# Patient Record
Sex: Male | Born: 1985 | Hispanic: Yes | Marital: Single | Smoking: Former smoker
Health system: Southern US, Community
[De-identification: ages and names within clinical notes are randomized; demographics above are authoritative.]

## PROBLEM LIST (undated history)

## (undated) DIAGNOSIS — T753XXA Motion sickness, initial encounter: Secondary | ICD-10-CM

## (undated) DIAGNOSIS — Z973 Presence of spectacles and contact lenses: Secondary | ICD-10-CM

## (undated) HISTORY — PX: NO PAST SURGERIES: SHX2092

---

## 2012-01-05 ENCOUNTER — Emergency Department: Payer: Self-pay | Admitting: Emergency Medicine

## 2012-01-05 LAB — BASIC METABOLIC PANEL
Anion Gap: 10 (ref 7–16)
BUN: 14 mg/dL (ref 7–18)
Chloride: 105 mmol/L (ref 98–107)
Co2: 26 mmol/L (ref 21–32)
EGFR (African American): >60
Glucose: 100 mg/dL — ABNORMAL HIGH (ref 65–99)
Osmolality: 282 (ref 275–301)
Potassium: 3.5 mmol/L (ref 3.5–5.1)
Sodium: 141 mmol/L (ref 136–145)

## 2012-01-05 LAB — CBC
HCT: 41.8 % (ref 40.0–52.0)
HGB: 14.1 g/dL (ref 13.0–18.0)
MCHC: 33.8 g/dL (ref 32.0–36.0)
RDW: 13.3 % (ref 11.5–14.5)
WBC: 4.8 10*3/uL (ref 3.8–10.6)

## 2016-07-29 ENCOUNTER — Encounter: Payer: Self-pay | Admitting: Emergency Medicine

## 2016-07-29 ENCOUNTER — Emergency Department
Admission: EM | Admit: 2016-07-29 | Discharge: 2016-07-29 | Disposition: A | Discharge status: Home / Self Care | Payer: No Typology Code available for payment source | Attending: Emergency Medicine | Admitting: Emergency Medicine

## 2016-07-29 DIAGNOSIS — Z87891 Personal history of nicotine dependence: Secondary | ICD-10-CM | POA: Diagnosis not present

## 2016-07-29 DIAGNOSIS — S3992XA Unspecified injury of lower back, initial encounter: Secondary | ICD-10-CM | POA: Diagnosis present

## 2016-07-29 DIAGNOSIS — Y999 Unspecified external cause status: Secondary | ICD-10-CM | POA: Diagnosis not present

## 2016-07-29 DIAGNOSIS — Y9389 Activity, other specified: Secondary | ICD-10-CM | POA: Insufficient documentation

## 2016-07-29 DIAGNOSIS — Y92410 Unspecified street and highway as the place of occurrence of the external cause: Secondary | ICD-10-CM | POA: Insufficient documentation

## 2016-07-29 DIAGNOSIS — M545 Low back pain, unspecified: Secondary | ICD-10-CM

## 2016-07-29 MED ORDER — ACETAMINOPHEN 325 MG PO TABS
650.0000 mg | ORAL_TABLET | Freq: Once | ORAL | Status: AC
  Administered 2016-07-29: 650 mg via ORAL
  Filled 2016-07-29: qty 2

## 2016-07-29 NOTE — Discharge Instructions (Signed)
Take Tylenol as needed for pain relief. [facility].
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
## 2018-04-20 ENCOUNTER — Emergency Department
Admission: EM | Admit: 2018-04-20 | Discharge: 2018-04-20 | Disposition: A | Discharge status: Home / Self Care | Payer: Self-pay | Attending: Emergency Medicine | Admitting: Emergency Medicine

## 2018-04-20 ENCOUNTER — Other Ambulatory Visit: Payer: Self-pay

## 2018-04-20 ENCOUNTER — Emergency Department: Payer: Self-pay

## 2018-04-20 DIAGNOSIS — Z87891 Personal history of nicotine dependence: Secondary | ICD-10-CM | POA: Insufficient documentation

## 2018-04-20 DIAGNOSIS — X509XXA Other and unspecified overexertion or strenuous movements or postures, initial encounter: Secondary | ICD-10-CM | POA: Insufficient documentation

## 2018-04-20 DIAGNOSIS — Y93H2 Activity, gardening and landscaping: Secondary | ICD-10-CM | POA: Insufficient documentation

## 2018-04-20 DIAGNOSIS — Y998 Other external cause status: Secondary | ICD-10-CM | POA: Insufficient documentation

## 2018-04-20 DIAGNOSIS — Y92017 Garden or yard in single-family (private) house as the place of occurrence of the external cause: Secondary | ICD-10-CM | POA: Insufficient documentation

## 2018-04-20 DIAGNOSIS — S82842A Displaced bimalleolar fracture of left lower leg, initial encounter for closed fracture: Secondary | ICD-10-CM | POA: Insufficient documentation

## 2018-04-20 MED ORDER — IBUPROFEN 600 MG PO TABS: 600.0000 mg | ORAL_TABLET | Freq: Three times a day (TID) | ORAL | 0 refills | Status: DC | PRN

## 2018-04-20 MED ORDER — IBUPROFEN 600 MG PO TABS
600.0000 mg | ORAL_TABLET | Freq: Once | ORAL | Status: AC
  Administered 2018-04-20: 600 mg via ORAL
  Filled 2018-04-20: qty 1

## 2018-04-20 MED ORDER — TRAMADOL HCL 50 MG PO TABS
50.0000 mg | ORAL_TABLET | Freq: Once | ORAL | Status: AC
  Administered 2018-04-20: 50 mg via ORAL
  Filled 2018-04-20: qty 1

## 2018-04-20 MED ORDER — TRAMADOL HCL 50 MG PO TABS: 50.0000 mg | ORAL_TABLET | Freq: Four times a day (QID) | ORAL | 0 refills | Status: DC | PRN

## 2018-04-20 NOTE — ED Provider Notes (Addendum)
[facility] Emergency Department Provider Note   ____________________________________________   First MD Initiated Contact with Patient 04/20/18 [phone]     (approximate)  I have reviewed the triage vital signs and the nursing notes.   HISTORY  Chief Complaint Ankle Pain    HPI Reginald Duffy is a 32 y.o. male patient with left ankle pain edema secondary to a twisting incident 2 days ago.  Patient state he was performing yard work when he twisted his ankle.  Patient stated no change in edema/ pain since accident.  Pain increased with weightbearing.  Patient rates pain as a 4/10.  Patient described the pain is "aching".  No palliative measures for complaint.  History reviewed. No pertinent past medical history.  There are no active problems to display for this patient.   History reviewed. No pertinent surgical history.  Prior to Admission medications   Medication Sig Start Date End Date Taking? Authorizing Provider  ibuprofen (ADVIL,MOTRIN) 600 MG tablet Take 1 tablet (600 mg total) by mouth every 8 (eight) hours as needed. 04/20/18   Joni Reining, PA-C  traMADol (ULTRAM) 50 MG tablet Take 1 tablet (50 mg total) by mouth every 6 (six) hours as needed. 04/20/18 04/20/19  Joni Reining, PA-C    Allergies Patient has no known allergies.  No family history on file.  Social History Social History   Tobacco Use  . Smoking status: Former Games developer  . Smokeless tobacco: Never Used  Substance Use Topics  . Alcohol use: Yes  . Drug use: Not on file    Review of Systems Constitutional: No fever/chills Eyes: No visual changes. ENT: No sore throat. Cardiovascular: Denies chest pain. Respiratory: Denies shortness of breath. Gastrointestinal: No abdominal pain.  No nausea, no vomiting.  No diarrhea.  No constipation. Genitourinary: Negative for dysuria. Musculoskeletal: Left ankle pain. Skin: Negative for rash. Neurological: Negative for  headaches, focal weakness or numbness.   ____________________________________________   PHYSICAL EXAM:  VITAL SIGNS: ED Triage Vitals  Enc Vitals Group     BP 04/20/18 0756 134/86     Pulse Rate 04/20/18 0756 79     Resp 04/20/18 0756 18     Temp 04/20/18 0756 98.3 F (36.8 C)     Temp Source 04/20/18 0756 Oral     SpO2 04/20/18 0756 96 %     Weight 04/20/18 0753 210 lb (95.3 kg)     Height 04/20/18 0753 5\' 10"  (1.778 m)     Head Circumference --      Peak Flow --      Pain Score 04/20/18 0753 4     Pain Loc --      Pain Edu? --      Excl. in GC? --    Constitutional: Alert and oriented. Well appearing and in no acute distress. Cardiovascular: Normal rate, regular rhythm. Grossly normal heart sounds.  Good peripheral circulation. Respiratory: Normal respiratory effort.  No retractions. Lungs CTAB. Musculoskeletal: No obvious deformity to the left ankle.  Mild edema.  Decreased range of motion with eversion movements. Neurologic:  Normal speech and language. No gross focal neurologic deficits are appreciated. No gait instability. Skin:  Skin is warm, dry and intact. No rash noted. Psychiatric: Mood and affect are normal. Speech and behavior are normal.  ____________________________________________   LABS (all labs ordered are listed, but only abnormal results are displayed)  Labs Reviewed - No data to display ____________________________________________  EKG   ____________________________________________  RADIOLOGY  ED MD interpretation:    Official radiology report(s): Dg Ankle Complete Left  Result Date: 04/20/2018 CLINICAL DATA:  Left ankle pain after fall 2 days ago. EXAM: LEFT ANKLE COMPLETE - 3+ VIEW COMPARISON:  None. FINDINGS: Moderately displaced medial malleolar fracture is noted. Also noted is mildly displaced oblique fracture of distal left fibula. Joint spaces are intact. IMPRESSION: Bimalleolar fractures as described above. Electronically Signed    By: Lupita RaiderJames  Green Jr, M.D.   On: 04/20/2018 08:35    ____________________________________________   PROCEDURES  Procedure(s) performed: None  .Splint Application Date/Time: 04/20/2018 9:22 AM Performed by: Nyoka LintLopez, Tracey D, NT Authorized by: Joni ReiningSmith, Ronald K, PA-C   Consent:    Consent obtained:  Verbal   Consent given by:  Patient   Risks discussed:  Numbness, pain and swelling Pre-procedure details:    Sensation:  Normal Procedure details:    Laterality:  Left   Location:  Ankle   Ankle:  L ankle   Strapping: no     Cast type:  Short leg   Splint type:  Sugar tong   Supplies:  Cotton padding, elastic bandage and Ortho-Glass Post-procedure details:    Pain:  Unchanged   Sensation:  Normal   Patient tolerance of procedure:  Tolerated well, no immediate complications    Critical Care performed: No  ____________________________________________   INITIAL IMPRESSION / ASSESSMENT AND PLAN / ED COURSE  As part of my medical decision making, I reviewed the following data within the electronic MEDICAL RECORD NUMBER    Left ankle pain secondary to fracture.  Discussed x-ray findings with patient.  Discussed case with on-call orthopedic doctor.  Patient placed in a splint and given crutch for ambulation.  Patient will contact the orthopedic clinic to schedule appointment for definitive evaluation and treatment.  Patient given a work note.  Take medication as directed.      ____________________________________________   FINAL CLINICAL IMPRESSION(S) / ED DIAGNOSES  Final diagnoses:  Bimalleolar fracture of left ankle, closed, initial encounter     ED Discharge Orders         Ordered    traMADol (ULTRAM) 50 MG tablet  Every 6 hours PRN     04/20/18 0909    ibuprofen (ADVIL,MOTRIN) 600 MG tablet  Every 8 hours PRN     04/20/18 0909           Note:  This document was prepared using Dragon voice recognition software and may include unintentional dictation errors.      Joni ReiningSmith, Ronald K, PA-C 04/20/18 0912    Joni ReiningSmith, Ronald K, PA-C 04/20/18 [phone]    Emily FilbertWilliams, Jonathan E, MD 04/20/18 1012

## 2018-04-20 NOTE — ED Triage Notes (Signed)
Pt states he twisted his left ankle on Saturday and is having pain and swelling

## 2018-04-20 NOTE — Discharge Instructions (Addendum)
Wear splint and ambulate with crutches until evaluation by orthopedic doctor.  Call today to schedule appointment.

## 2018-04-20 NOTE — ED Notes (Signed)
See triage note  Presents s/p fall   States he fell down some steps on Saturday  Positive swelling noted good pulses

## 2018-04-23 ENCOUNTER — Other Ambulatory Visit: Payer: Self-pay

## 2018-04-23 ENCOUNTER — Encounter: Payer: Self-pay | Admitting: *Deleted

## 2018-04-30 ENCOUNTER — Ambulatory Visit: Payer: Self-pay

## 2018-04-30 ENCOUNTER — Ambulatory Visit: Payer: Self-pay | Admitting: Anesthesiology

## 2018-04-30 ENCOUNTER — Ambulatory Visit
Admission: RE | Admit: 2018-04-30 | Discharge: 2018-04-30 | Disposition: A | Discharge status: Home / Self Care | Payer: Self-pay | Source: Ambulatory Visit | Attending: Orthopedic Surgery | Admitting: Orthopedic Surgery

## 2018-04-30 ENCOUNTER — Encounter
Admission: RE | Disposition: A | Discharge status: Home / Self Care | Payer: Self-pay | Source: Ambulatory Visit | Attending: Orthopedic Surgery

## 2018-04-30 DIAGNOSIS — S82843A Displaced bimalleolar fracture of unspecified lower leg, initial encounter for closed fracture: Secondary | ICD-10-CM

## 2018-04-30 DIAGNOSIS — X501XXA Overexertion from prolonged static or awkward postures, initial encounter: Secondary | ICD-10-CM | POA: Insufficient documentation

## 2018-04-30 DIAGNOSIS — Y92007 Garden or yard of unspecified non-institutional (private) residence as the place of occurrence of the external cause: Secondary | ICD-10-CM | POA: Insufficient documentation

## 2018-04-30 DIAGNOSIS — Z87891 Personal history of nicotine dependence: Secondary | ICD-10-CM | POA: Insufficient documentation

## 2018-04-30 DIAGNOSIS — S82842A Displaced bimalleolar fracture of left lower leg, initial encounter for closed fracture: Secondary | ICD-10-CM | POA: Insufficient documentation

## 2018-04-30 HISTORY — PX: ANKLE ARTHROSCOPY WITH OPEN REDUCTION INTERNAL FIXATION (ORIF): SHX5582

## 2018-04-30 HISTORY — DX: Presence of spectacles and contact lenses: Z97.3

## 2018-04-30 HISTORY — DX: Motion sickness, initial encounter: T75.3XXA

## 2018-04-30 SURGERY — ANKLE ARTHROSCOPY WITH OPEN REDUCTION INTERNAL FIXATION (ORIF)
Anesthesia: Regional | Site: Ankle | Laterality: Left

## 2018-04-30 MED ORDER — DEXTROSE 5 % IV SOLN
2000.0000 mg | Freq: Once | INTRAVENOUS | Status: AC
  Administered 2018-04-30: 2000 mg via INTRAVENOUS

## 2018-04-30 MED ORDER — OXYCODONE HCL 5 MG PO TABS
5.0000 mg | ORAL_TABLET | Freq: Once | ORAL | Status: AC | PRN
  Administered 2018-04-30: 5 mg via ORAL

## 2018-04-30 MED ORDER — LACTATED RINGERS IV SOLN
INTRAVENOUS | Status: DC
  Administered 2018-04-30 (×2): via INTRAVENOUS

## 2018-04-30 MED ORDER — DEXAMETHASONE SODIUM PHOSPHATE 4 MG/ML IJ SOLN
INTRAMUSCULAR | Status: DC | PRN
  Administered 2018-04-30: 4 mg via INTRAVENOUS

## 2018-04-30 MED ORDER — FENTANYL CITRATE (PF) 100 MCG/2ML IJ SOLN: 25.0000 ug | INTRAMUSCULAR | Status: DC | PRN

## 2018-04-30 MED ORDER — ONDANSETRON HCL 4 MG/2ML IJ SOLN
INTRAMUSCULAR | Status: DC | PRN
  Administered 2018-04-30: 4 mg via INTRAVENOUS

## 2018-04-30 MED ORDER — PROPOFOL 10 MG/ML IV BOLUS
INTRAVENOUS | Status: DC | PRN
  Administered 2018-04-30: 200 mg via INTRAVENOUS
  Administered 2018-04-30: 50 mg via INTRAVENOUS

## 2018-04-30 MED ORDER — MIDAZOLAM HCL 5 MG/5ML IJ SOLN
INTRAMUSCULAR | Status: DC | PRN
  Administered 2018-04-30: 2 mg via INTRAVENOUS

## 2018-04-30 MED ORDER — FENTANYL CITRATE (PF) 100 MCG/2ML IJ SOLN
INTRAMUSCULAR | Status: DC | PRN
  Administered 2018-04-30 (×3): 25 ug via INTRAVENOUS
  Administered 2018-04-30: 100 ug via INTRAVENOUS
  Administered 2018-04-30: 25 ug via INTRAVENOUS

## 2018-04-30 MED ORDER — GLYCOPYRROLATE 0.2 MG/ML IJ SOLN
INTRAMUSCULAR | Status: DC | PRN
  Administered 2018-04-30: 0.1 mg via INTRAVENOUS

## 2018-04-30 MED ORDER — OXYCODONE HCL 5 MG PO TABS: 5.0000 mg | ORAL_TABLET | ORAL | 0 refills | Status: AC | PRN

## 2018-04-30 MED ORDER — ONDANSETRON HCL 4 MG/2ML IJ SOLN: 4.0000 mg | Freq: Once | INTRAMUSCULAR | Status: DC | PRN

## 2018-04-30 MED ORDER — ONDANSETRON 4 MG PO TBDP: 4.0000 mg | ORAL_TABLET | Freq: Three times a day (TID) | ORAL | 0 refills | Status: AC | PRN

## 2018-04-30 MED ORDER — ACETAMINOPHEN 500 MG PO TABS: 1000.0000 mg | ORAL_TABLET | Freq: Three times a day (TID) | ORAL | 2 refills | Status: AC

## 2018-04-30 MED ORDER — OXYCODONE HCL 5 MG/5ML PO SOLN: 5.0000 mg | Freq: Once | ORAL | Status: AC | PRN

## 2018-04-30 MED ORDER — ASPIRIN EC 325 MG PO TBEC: 325.0000 mg | DELAYED_RELEASE_TABLET | Freq: Every day | ORAL | 0 refills | Status: AC

## 2018-04-30 SURGICAL SUPPLY — 56 items
2.6MM DRILL BIT ×2 IMPLANT
APPLICATOR CHLORAPREP 1ML CHG (MISCELLANEOUS) ×4 IMPLANT
BANDAGE ELASTIC 4 LF NS (GAUZE/BANDAGES/DRESSINGS) ×3 IMPLANT
BANDAGE ELASTIC 6 LF NS (GAUZE/BANDAGES/DRESSINGS) ×2 IMPLANT
BIT DRILL 2.0 (BIT) ×1
BIT DRILL 2.0MM (BIT) ×1
BIT DRILL 2XNS DISP SS SM FRAG (BIT) IMPLANT
BIT DRL 2XNS DISP SS SM FRAG (BIT) ×1
BLADE SURG 15 STRL LF DISP TIS (BLADE) IMPLANT
BLADE SURG 15 STRL SS (BLADE) ×4
BNDG COHESIVE 4X5 TAN STRL (GAUZE/BANDAGES/DRESSINGS) ×3 IMPLANT
BNDG ESMARK 4X12 TAN STRL LF (GAUZE/BANDAGES/DRESSINGS) ×3 IMPLANT
BNDG STRETCH 4X75 STRL LF (GAUZE/BANDAGES/DRESSINGS) ×1 IMPLANT
CANISTER SUCT 1200ML W/VALVE (MISCELLANEOUS) ×3 IMPLANT
DRAPE C-ARM XRAY 36X54 (DRAPES) ×2 IMPLANT
DRAPE C-ARMOR (DRAPES) ×2 IMPLANT
DRAPE IMP U-DRAPE 54X76 (DRAPES) ×2 IMPLANT
DRAPE INCISE IOBAN 66X45 STRL (DRAPES) ×2 IMPLANT
ELECT REM PT RETURN 9FT ADLT (ELECTROSURGICAL) ×3
ELECTRODE REM PT RTRN 9FT ADLT (ELECTROSURGICAL) ×1 IMPLANT
GAUZE PETRO XEROFOAM 1X8 (MISCELLANEOUS) ×3 IMPLANT
GAUZE SPONGE 4X4 12PLY STRL (GAUZE/BANDAGES/DRESSINGS) ×3 IMPLANT
GLOVE BIO SURGEON STRL SZ7.5 (GLOVE) ×9 IMPLANT
GLOVE INDICATOR 8.0 STRL GRN (GLOVE) ×7 IMPLANT
GOWN STRL REIN 2XL LVL4 (GOWN DISPOSABLE) ×2 IMPLANT
GOWN STRL REUS W/ TWL LRG LVL3 (GOWN DISPOSABLE) ×2 IMPLANT
GOWN STRL REUS W/TWL LRG LVL3 (GOWN DISPOSABLE) ×4
KIT TURNOVER KIT A (KITS) ×3 IMPLANT
NS IRRIG 500ML POUR BTL (IV SOLUTION) ×3 IMPLANT
PACK EXTREMITY ARMC (MISCELLANEOUS) ×3 IMPLANT
PAD CAST CTTN 4X4 STRL (SOFTGOODS) IMPLANT
PADDING CAST 4IN STRL (MISCELLANEOUS) ×2
PADDING CAST BLEND 4X4 STRL (MISCELLANEOUS) IMPLANT
PADDING CAST BLEND 6X4 STRL (MISCELLANEOUS) IMPLANT
PADDING CAST COTTON 4X4 STRL (SOFTGOODS) ×4
PADDING STRL CAST 6IN (MISCELLANEOUS) ×4
PENCIL SMOKE EVACUATOR (MISCELLANEOUS) ×3 IMPLANT
PLATE DISTAL FIBULA 3HOLE (Plate) ×2 IMPLANT
SCREW 40X4.0MM (Screw) ×4 IMPLANT
SCREW BONE 14MMX3.5MM (Screw) ×6 IMPLANT
SCREW LOCK 3.5X14 (Screw) ×2 IMPLANT
SCREW LOCKING 3.5X12 (Screw) ×2 IMPLANT
SCREW LOCKING 3.5X16MM (Screw) ×2 IMPLANT
SLEEVE PROTECTION STRL DISP (MISCELLANEOUS) ×2 IMPLANT
SPLINT FAST PLASTER 5X30 (CAST SUPPLIES) ×2
SPLINT PLASTER CAST FAST 5X30 (CAST SUPPLIES) IMPLANT
SPONGE LAP 18X18 RF (DISPOSABLE) ×4 IMPLANT
STAPLER SKIN PROX 35W (STAPLE) ×2 IMPLANT
STOCKINETTE IMPERVIOUS LG (DRAPES) ×3 IMPLANT
STRYKER 1.4MM  THREADED WIRE ×8 IMPLANT
STRYKER 2.7MM CANNULATED DRILL ×2 IMPLANT
STYKER ANKLE SOLUTIONS 28MM 2.7 BONE SCREW (Screw) ×2 IMPLANT
SUT VIC AB 0 CT1 27 (SUTURE) ×2
SUT VIC AB 0 CT1 27XCR 8 STRN (SUTURE) ×1 IMPLANT
SUT VIC AB 3-0 SH 27 (SUTURE) ×4
SUT VIC AB 3-0 SH 27X BRD (SUTURE) ×1 IMPLANT

## 2018-04-30 NOTE — Anesthesia Procedure Notes (Signed)
Procedure Name: LMA Insertion Date/Time: 04/30/2018 2:11 PM Performed by: Maree Krabbe, CRNA Pre-anesthesia Checklist: Patient identified, Emergency Drugs available, Suction available, Timeout performed and Patient being monitored Patient Re-evaluated:Patient Re-evaluated prior to induction Oxygen Delivery Method: Circle system utilized Preoxygenation: Pre-oxygenation with 100% oxygen Induction Type: IV induction LMA: LMA inserted LMA Size: 4.0 Number of attempts: 1 Placement Confirmation: positive ETCO2 and breath sounds checked- equal and bilateral Tube secured with: Tape Dental Injury: Teeth and Oropharynx as per pre-operative assessment

## 2018-04-30 NOTE — Op Note (Signed)
Operative Note    SURGERY DATE: 04/30/2018   PRE-OP DIAGNOSIS:  1. L ankle bimalleolar ankle fracture   POST-OP DIAGNOSIS:  1. L ankle bimalleolar ankle fracture  PROCEDURE(S): 1. ORIF L ankle (bimalleolar ankle fracture with fixation of lateral and medial malleoli)    SURGEON: Rosealee Albee, MD    ANESTHESIA: Regional + Gen   ESTIMATED BLOOD LOSS: 200cc   DRAINS:  None   TOTAL IV FLUIDS: see anesthesia record  IMPLANTS: Stryker VariAx distal fibular plate 3 - 1.6XW Stryker locking screws distally (lateral malleolus) 3 - 3.37mm Stryker cortical screws proximally (lateral malleolus) 1 - 2.17mm Stryker lag screws (lateral malleolus) 2 - 4.103mm Stryker cannulated screws (medial malleolus))  INDICATION(S): Reginald Duffy is a 32 y.o. male who had a twisting injury while doing yard work ~2 weeks ago. He noted immediate ankle pain. Radiographs in the ED showed a bimalleolar ankle fracture. After discussion of risks, benefits, and alternatives to surgery, the patient elected to proceed with above procedure.    OPERATIVE FINDINGS: bimalleolar ankle fracture   OPERATIVE REPORT:   The patient was seen in the Holding Room. The risks, benefits, complications, treatment options, and expected outcomes were discussed with the patient. The risks and potential complications of the problem and proposed treatment include but are not limited to infection, bleeding, pain, stiffness, nerve and vessel injury, hardware failure or irritation, nonunion/malunion, and complication secondary to the anesthetic. The patient concurred with the proposed plan, giving informed consent.  The site of surgery was properly noted/marked.   The patient was taken to Operating Room and transferred to the operating room table. A Time Out was held and the patient identity, procedure, and laterality was confirmed. After administration of adequate anesthesia, the entire lower extremity was prescrubbed with Hibiclens and  alcohol, prepped with Chloroprep, and draped in sterile fashion. The patient was given pre-operative IV antibiotics within 30 minutes of the skin incision.   The tourniquet was inflated to after exsanguinating the leg with an Esmarch bandage. A standard distal fibular incision was made with a 15 blade along the lateral aspect of the fibula. The superficial peroneal nerve was not visualized within the field of dissection. Dissection was carried down to the fibula. The fracture site was identified and cleared of any tissue with a combination of dental pick, knife, and curette.  A reduction clamp was placed and the fracture was reduced. Reduction was confirmed visually and fluoroscopically. One 2.50mm lag screw was placed in an A-P fashion. The reduction clamp was removed and the fracture remained appropriately reduced.   A Stryker VariAx Distal Fibula plate was selected after confirming appropriate size and position fluoroscopically. Three locking screws were placed in the distal fragment. Then 3 additional cortical screws were placed in the proximal fragment. Fluoroscopy then confirmed appropriate hardware position and reduction.  Next, a standard medial malleolar incision was made with a 15 blade along the medial aspect of the tibia.  Dissection was carried down sharply to the tibia. The periosteum overlying the fracture site was elevated. The fracture site was identified and cleared of any tissue with a combination of dental pick, knife, and curette. The joint was inspected and cleared of any debris. The joint was thoroughly irrigated.  The main medial malleolar fracture was reduced anatomically under direct visualization and K-wires were used to hold the reduction. Reduction was confirmed visually and fluoroscopically.  A cannulated drill was used to overdrill the K-wires. Two partially threaded 4.20mm cannulated screws were  then placed and tightened sequentially. Fluoroscopy was then used to  confirm appropriate hardware position and reduction. External rotation stress test was negative.  Tourniquet was released at 113 minutes. Hemostasis was achieved with bovie electrocautery. The wounds were then thoroughly irrigated. 0 Vicryl sutures were used to close the deep layer over the lateral plate. 3-0 Vicryl was used to close the subdermal layers. Staples were used to close the skin. The wounds were dressed with xeroform, fluffs, and cotton guaze.  The leg was then placed in a short leg splint.   The patient was awakened from anesthesia without any further complication and transferred to PACU for further recovery.    POST-OPERATIVE PLAN:  NWB for 6 weeks on operative extremity. ASA 325mg /daily x 4 weeks for DVT ppx. Plan for discharge home. F/U as outpatient in 2 weeks.

## 2018-04-30 NOTE — Anesthesia Preprocedure Evaluation (Signed)
Anesthesia Evaluation  Patient identified by MRN, date of birth, ID band Patient awake    Reviewed: Allergy & Precautions, H&P , NPO status , Patient's Chart, lab work & pertinent test results  History of Anesthesia Complications Negative for: history of anesthetic complications  Airway Mallampati: I  TM Distance: >3 FB Neck ROM: full    Dental no notable dental hx.    Pulmonary former smoker,    Pulmonary exam normal breath sounds clear to auscultation       Cardiovascular negative cardio ROS Normal cardiovascular exam     Neuro/Psych negative neurological ROS     GI/Hepatic negative GI ROS, Neg liver ROS,   Endo/Other  negative endocrine ROS  Renal/GU negative Renal ROS  negative genitourinary   Musculoskeletal   Abdominal   Peds  Hematology negative hematology ROS (+)   Anesthesia Other Findings   Reproductive/Obstetrics negative OB ROS                             Anesthesia Physical Anesthesia Plan  ASA: II  Anesthesia Plan: General LMA and Regional   Post-op Pain Management: GA combined w/ Regional for post-op pain   Induction:   PONV Risk Score and Plan:   Airway Management Planned:   Additional Equipment:   Intra-op Plan:   Post-operative Plan:   Informed Consent: I have reviewed the patients History and Physical, chart, labs and discussed the procedure including the risks, benefits and alternatives for the proposed anesthesia with the patient or authorized representative who has indicated his/her understanding and acceptance.     Plan Discussed with:   Anesthesia Plan Comments:         Anesthesia Quick Evaluation

## 2018-04-30 NOTE — Anesthesia Procedure Notes (Addendum)
Anesthesia Regional Block: Adductor canal block   Pre-Anesthetic Checklist: ,, timeout performed, Correct Patient, Correct Site, Correct Laterality, Correct Procedure, Correct Position, site marked, Risks and benefits discussed,  Surgical consent,  Pre-op evaluation,  At surgeon's request and post-op pain management  Laterality: Left  Prep: chloraprep       Needles:  Injection technique: Single-shot  Needle Type: Stimiplex     Needle Length: 9cm  Needle Gauge: 21     Additional Needles:   Procedures:,,,, ultrasound used (permanent image in chart),,,,  Narrative:  Start time: 04/30/2018 12:35 PM End time: 04/30/2018 12:45 PM Injection made incrementally with aspirations every 5 mL.  Performed by: Personally  Anesthesiologist: Jolayne Panther, MD  Additional Notes: Functioning IV was confirmed and monitors applied. Ultrasound guidance: relevant anatomy identified, needle position confirmed, local anesthetic spread visualized around nerve(s)., vascular puncture avoided.  Image printed for medical record.  Negative aspiration and no paresthesias; incremental administration of local anesthetic. The patient tolerated the procedure well. Vitals signes recorded in RN notes.  Total 20 mL 0.5% Ropivacaine with 1:400 K epi injected incrementally.

## 2018-04-30 NOTE — Discharge Instructions (Signed)
Ankle Fracture Surgery  Post-Op Instructions  1. Bracing or crutches: Crutches will be provided at the time of discharge.  2. Splint/Cast: You will have a splint (3/4 cast) on your leg after surgery. Ensure that this remains clean and dry until follow up appointment. If this becomes wet, you need to call our offices to get it changed or else you risk skin breakdown.    3. Driving:  Plan on not driving for at least four to six weeks. Please note that you are advised NOT to drive while taking narcotic pain medications as you may be impaired and unsafe to drive.  4. Activity: Ankle pumps several times an hour while awake to prevent blood clots. Weight bearing: Non-weight bearing. Use crutches for at least 6 weeks, if not longer based on your surgery. Bending and straightening the knee is unlimited. Elevate knee above heart level as much as possible for one week. Avoid standing more than 5 minutes (consecutively) for the first week.  Avoid long distance travel for 4 weeks.  5. Medications:  - You have been provided a prescription for narcotic pain medicine. After surgery, take 1-2 narcotic tablets every 4 hours if needed for severe pain. Please start this as soon as you begin to start having pain (if you received a nerve block, start taking as soon as this wears off).  - A prescription for anti-nausea medication will be provided in case the narcotic medicine causes nausea - take 1 tablet every 6 hours only if nauseated.  - Take enteric coated aspirin 325 mg once daily for 4 weeks to prevent blood clots.  -Take tylenol 1000 mg every 8 hours for pain.  May stop tylenol 5 days after surgery if you are having minimal pain.  If you are taking prescription medication for anxiety, depression, insomnia, muscle spasm, chronic pain, or for attention deficit disorder you are advised that you are at a higher risk of adverse effects with use of narcotics post-op, including narcotic addiction/dependence,  depressed breathing, death. If you use non-prescribed substances: alcohol, marijuana, cocaine, heroin, methamphetamines, etc., you are at a higher risk of adverse effects with use of narcotics post-op, including narcotic addiction/dependence, depressed breathing, death. You are advised that taking > 50 morphine milligram equivalents (MME) of narcotic pain medication per day results in twice the risk of overdose or death. For your prescription provided: oxycodone 5 mg - taking more than 6 tablets per day. Be advised that we will prescribe narcotics short-term, for acute post-operative pain only - 1 week for minor operations such as knee arthroscopy for meniscus tear resection, and 3 weeks for major operations such as knee repair/reconstruction surgeries.   6. Physical Therapy: Plan to start after follow up appointment at 2 weeks. 1-2 times per week for ~12-16 weeks. Therapy typically starts on post operative Day 3 or 4. You have been provided an order for physical therapy. The therapist will provide home exercises. Please contact our offices if this appointment has not been scheduled.   7. Work/School: May return to full work when off of crutches. May do light duty/desk job or return to school in approximately 1-2 weeks when off of narcotics, pain is well-controlled, and swelling has decreased.  8. Post-Op Appointments: Your first post-op appointment will be with Dr. Allena KatzPatel in approximately 2 weeks time.   If you find that they have not been scheduled please call the [facility] front desk at [phone].        General Anesthesia, Adult, Care After  These instructions provide you with information about caring for yourself after your procedure. Your health care provider may also give you more specific instructions. Your treatment has been planned according to current medical practices, but problems sometimes occur. Call your health care provider if you have any problems or questions  after your procedure. What can I expect after the procedure? After the procedure, it is common to have:  Vomiting.  A sore throat.  Mental slowness.  It is common to feel:  Nauseous.  Cold or shivery.  Sleepy.  Tired.  Sore or achy, even in parts of your body where you did not have surgery.  Follow these instructions at home: For at least 24 hours after the procedure:  Do not: ? Participate in activities where you could fall or become injured. ? Drive. ? Use heavy machinery. ? Drink alcohol. ? Take sleeping pills or medicines that cause drowsiness. ? Make important decisions or sign legal documents. ? Take care of children on your own.  Rest. Eating and drinking  If you vomit, drink water, juice, or soup when you can drink without vomiting.  Drink enough fluid to keep your urine clear or pale yellow.  Make sure you have little or no nausea before eating solid foods.  Follow the diet recommended by your health care provider. General instructions  Have a responsible adult stay with you until you are awake and alert.  Return to your normal activities as told by your health care provider. Ask your health care provider what activities are safe for you.  Take over-the-counter and prescription medicines only as told by your health care provider.  If you smoke, do not smoke without supervision.  Keep all follow-up visits as told by your health care provider. This is important. Contact a health care provider if:  You continue to have nausea or vomiting at home, and medicines are not helpful.  You cannot drink fluids or start eating again.  You cannot urinate after 8-12 hours.  You develop a skin rash.  You have fever.  You have increasing redness at the site of your procedure. Get help right away if:  You have difficulty breathing.  You have chest pain.  You have unexpected bleeding.  You feel that you are having a life-threatening or urgent  problem. This information is not intended to replace advice given to you by your health care provider. Make sure you discuss any questions you have with your health care provider. Document Released: 10/21/2000 Document Revised: 12/18/2015 Document Reviewed: 06/29/2015 Elsevier Interactive Patient Education  [employer].

## 2018-04-30 NOTE — Anesthesia Procedure Notes (Signed)
Anesthesia Regional Block: Popliteal block   Pre-Anesthetic Checklist: ,, timeout performed, Correct Patient, Correct Site, Correct Laterality, Correct Procedure, Correct Position, site marked, Risks and benefits discussed,  Surgical consent,  Pre-op evaluation,  At surgeon's request and post-op pain management  Laterality: Left  Prep: chloraprep       Needles:  Injection technique: Single-shot  Needle Type: Stimiplex     Needle Length: 9cm  Needle Gauge: 21     Additional Needles:   Procedures:,,,, ultrasound used (permanent image in chart),,,,  Narrative:  Start time: 04/30/2018 12:35 PM End time: 04/30/2018 12:45 PM Injection made incrementally with aspirations every 5 mL.  Performed by: Personally  Anesthesiologist: Jolayne Panther, MD  Additional Notes: Functioning IV was confirmed and monitors applied. Ultrasound guidance: relevant anatomy identified, needle position confirmed, local anesthetic spread visualized around nerve(s)., vascular puncture avoided.  Image printed for medical record.  Negative aspiration and no paresthesias; incremental administration of local anesthetic. The patient tolerated the procedure well. Vitals signes recorded in RN notes.  Total 20 mL 0.5% Ropivacaine with 1:400 K epi injected incrementally.

## 2018-04-30 NOTE — Transfer of Care (Signed)
Immediate Anesthesia Transfer of Care Note  Patient: Reginald Duffy  Procedure(s) Performed: ANKLE WITH OPEN REDUCTION INTERNAL FIXATION (ORIF)POSSIBLE SYNDESMOSIS REPAIR (Left Ankle)  Patient Location: PACU  Anesthesia Type: General LMA, Regional  Level of Consciousness: awake, alert  and patient cooperative  Airway and Oxygen Therapy: Patient Spontanous Breathing and Patient connected to supplemental oxygen  Post-op Assessment: Post-op Vital signs reviewed, Patient's Cardiovascular Status Stable, Respiratory Function Stable, Patent Airway and No signs of Nausea or vomiting  Post-op Vital Signs: Reviewed and stable  Complications: No apparent anesthesia complications

## 2018-04-30 NOTE — H&P (Signed)
Paper H&P to be scanned into permanent record. H&P reviewed. No significant changes noted.  

## 2018-04-30 NOTE — Progress Notes (Signed)
Assisted Reginald Duffy ANMD with left, ultrasound guided, popliteal and adductor canal block. Side rails up, monitors on throughout procedure. See vital signs in flow sheet. Tolerated Procedure well.

## 2018-04-30 NOTE — Anesthesia Postprocedure Evaluation (Signed)
Anesthesia Post Note  Patient: Reginald Duffy  Procedure(s) Performed: ANKLE WITH OPEN REDUCTION INTERNAL FIXATION (ORIF)POSSIBLE SYNDESMOSIS REPAIR (Left Ankle)  Patient location during evaluation: PACU Anesthesia Type: Regional Level of consciousness: awake and alert Pain management: pain level controlled Vital Signs Assessment: post-procedure vital signs reviewed and stable Respiratory status: spontaneous breathing Cardiovascular status: blood pressure returned to baseline Anesthetic complications: no    Verner Chol, III,  Elantra Caprara D

## 2018-05-01 ENCOUNTER — Encounter: Payer: Self-pay | Admitting: Orthopedic Surgery

## 2018-05-05 ENCOUNTER — Encounter: Payer: Self-pay | Admitting: Orthopedic Surgery

## 2020-06-29 IMAGING — DX DG ANKLE COMPLETE 3+V*L*
3 series · 3 of 3 positions shown · non-contrast
Comparison: None.

CLINICAL DATA: Left ankle pain after fall 2 days ago.

EXAM:
LEFT ANKLE COMPLETE - 3+ VIEW

[ankle ap]
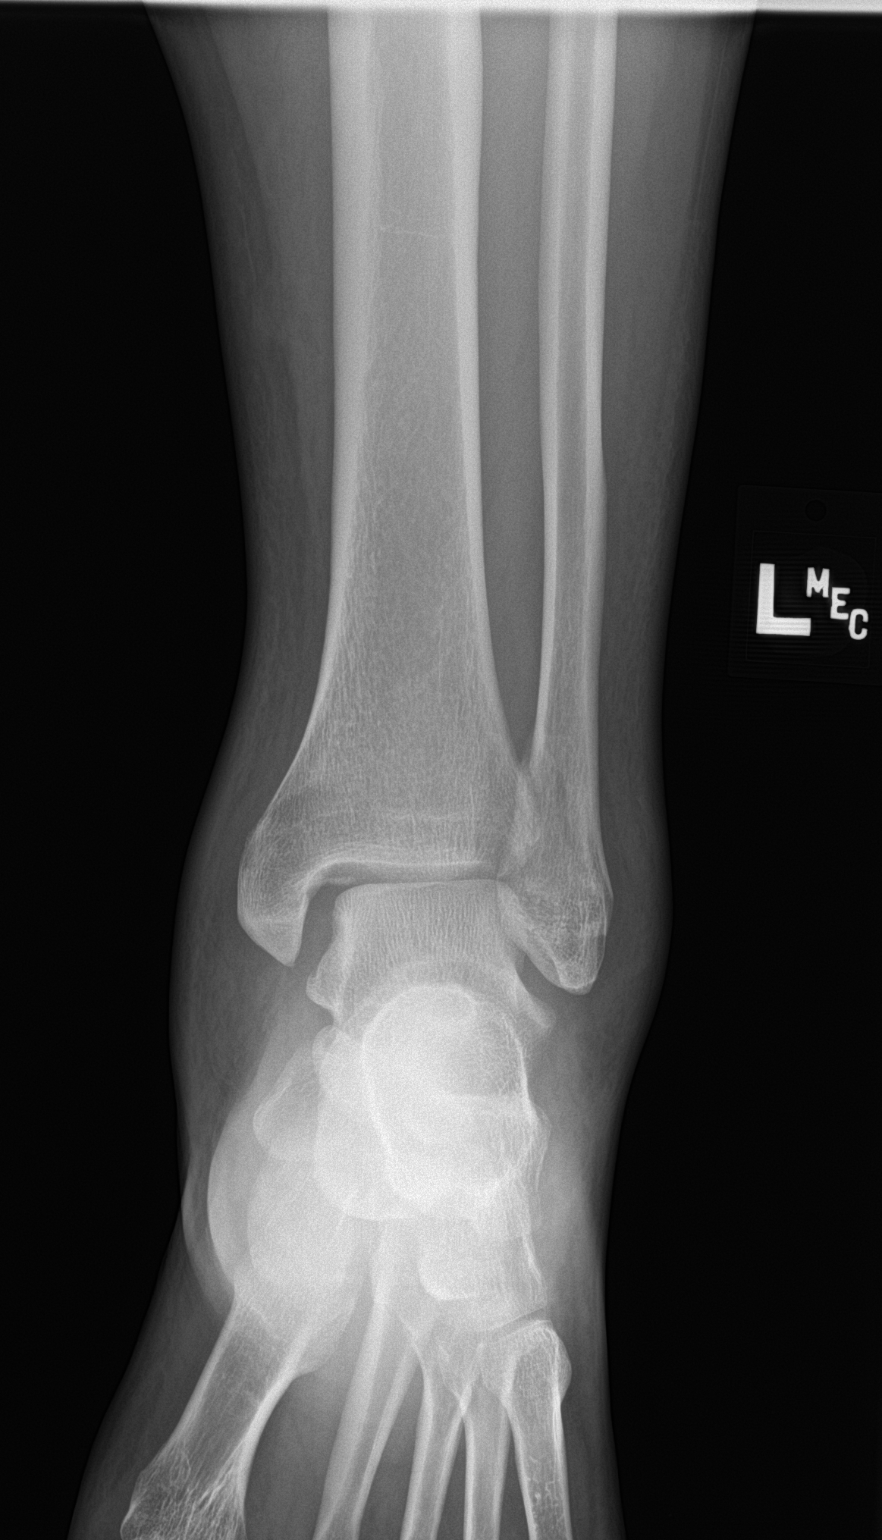

[ankle obl]
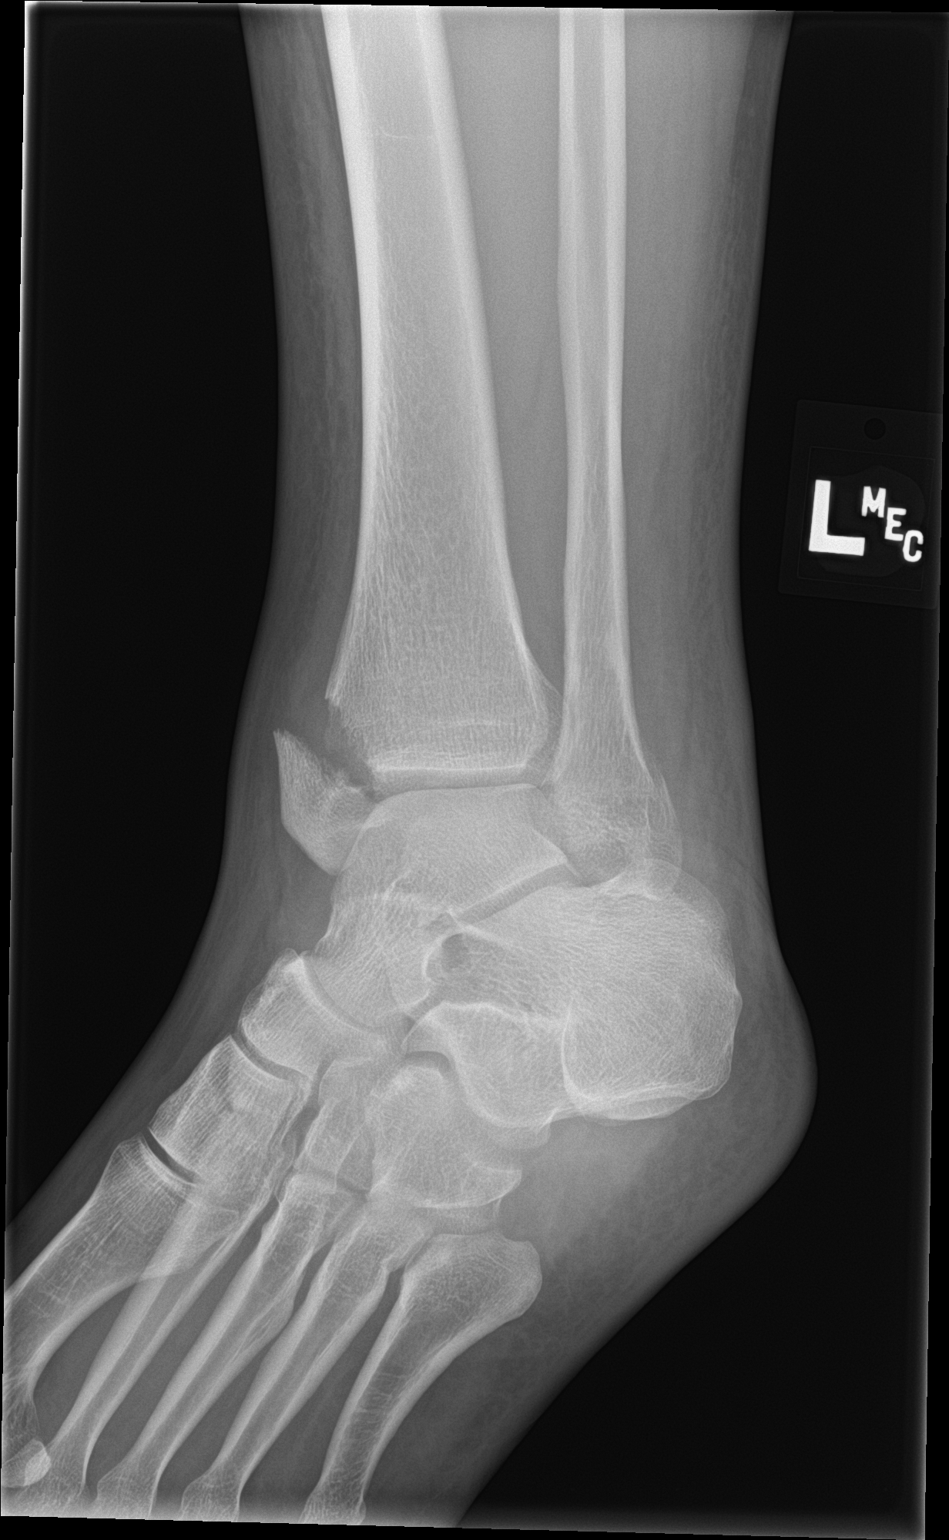

[ankle lat]
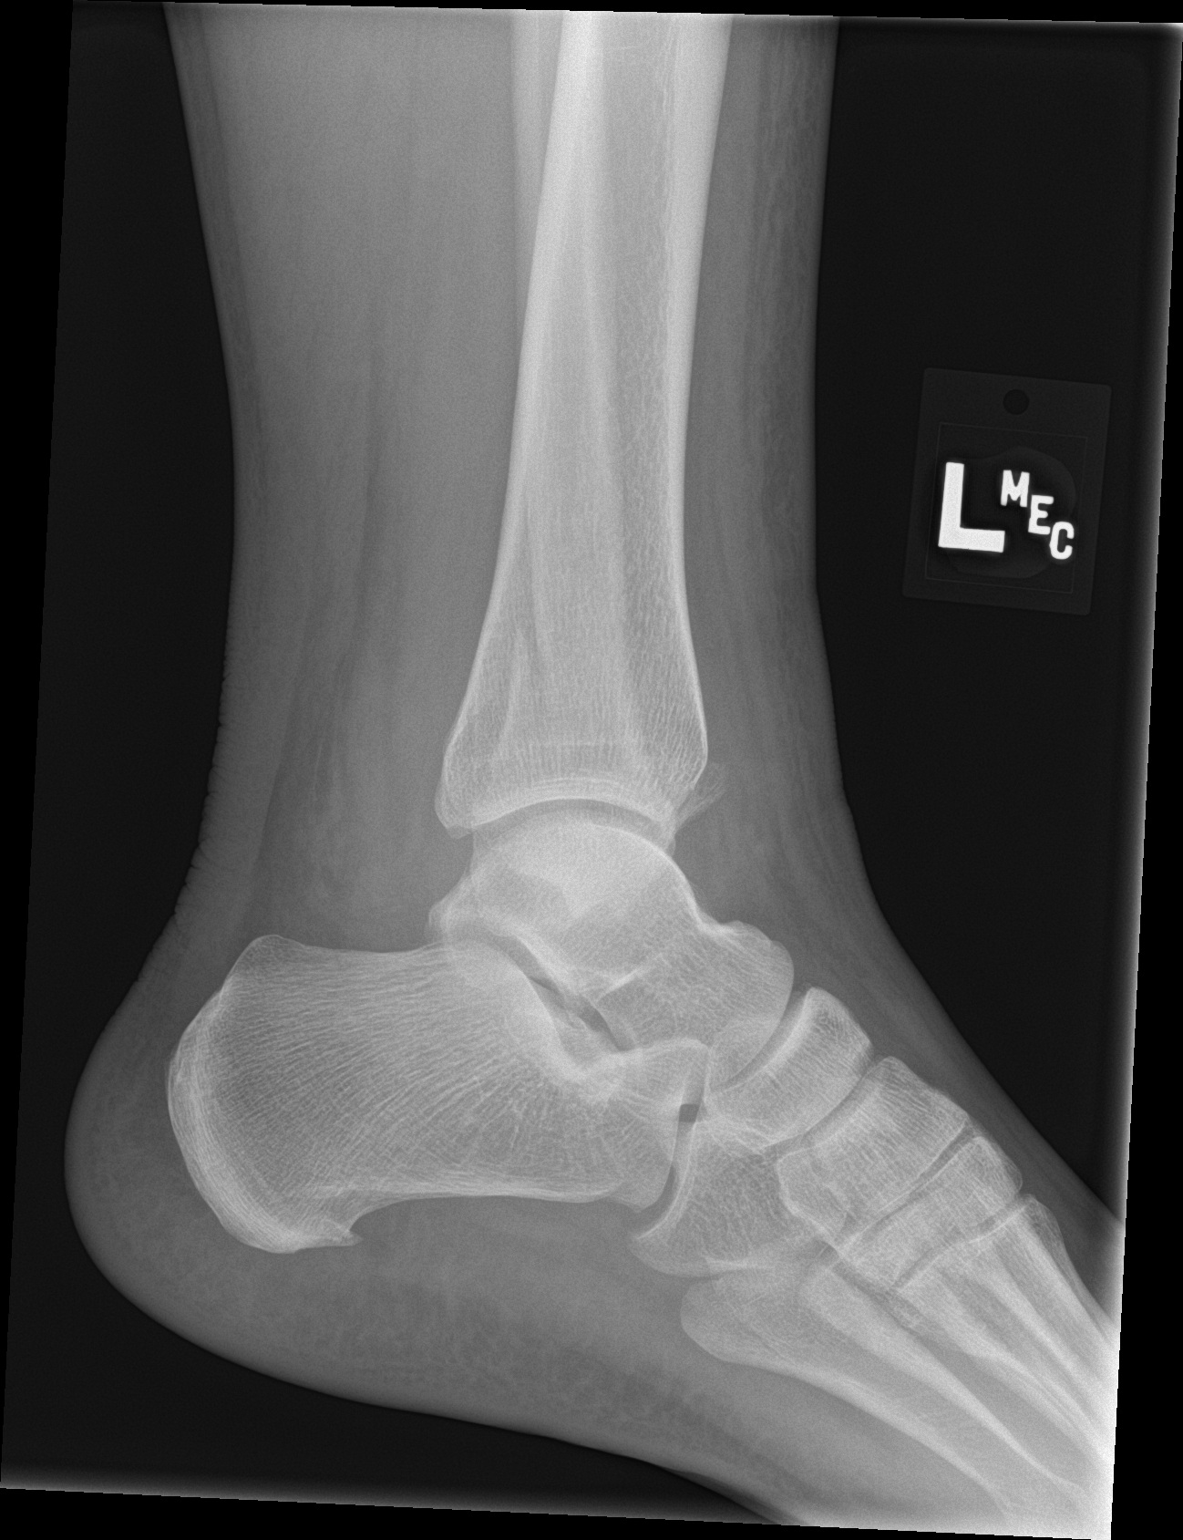

[3 of 3 positions shown; findings below may reference images not displayed]

FINDINGS: Moderately displaced medial malleolar fracture is noted. Also noted
is mildly displaced oblique fracture of distal left fibula. Joint
spaces are intact.
IMPRESSION: Bimalleolar fractures as described above.

## 2023-06-05 ENCOUNTER — Ambulatory Visit (INDEPENDENT_AMBULATORY_CARE_PROVIDER_SITE_OTHER): Payer: Self-pay | Admitting: Dermatology

## 2023-06-05 DIAGNOSIS — Z7189 Other specified counseling: Secondary | ICD-10-CM

## 2023-06-05 DIAGNOSIS — L230 Allergic contact dermatitis due to metals: Secondary | ICD-10-CM

## 2023-06-05 DIAGNOSIS — R21 Rash and other nonspecific skin eruption: Secondary | ICD-10-CM

## 2023-06-05 DIAGNOSIS — Z79899 Other long term (current) drug therapy: Secondary | ICD-10-CM

## 2023-06-05 MED ORDER — MOMETASONE FUROATE 0.1 % EX CREA: TOPICAL_CREAM | CUTANEOUS | 1 refills | Status: AC

## 2023-06-05 NOTE — Progress Notes (Signed)
   New Pt Visit   Subjective  Reginald Duffy is a 37 y.o. male who presents for the following: Rash - behind the ears x 1 years, dry, flaky, itchy, currently using Vaseline to help with dry/flaky/itchy skin. Pt doesn't have a rash any where else on the body. Pt has had the same glasses for 3 years.   The following portions of the chart were reviewed this encounter and updated as appropriate: medications, allergies, medical history  Review of Systems:  No other skin or systemic complaints except as noted in HPI or Assessment and Plan.  Objective  Well appearing patient in no apparent distress; mood and affect are within normal limits.  A focused examination was performed of the following areas: The face, neck, ears, elbows, and knees  Relevant exam findings are noted in the Assessment and Plan.    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 Elbows and knees clear today.   Chronic and persistent condition with duration or expected duration over one year. Condition is symptomatic/ bothersome to patient. Not currently at goal.  Treatment Plan: Start Mometasone 0.1% ointment to aa's BID up to two weeks. Then decrease to 5d/wk. Topical steroids (such as triamcinolone, fluocinolone, fluocinonide, mometasone, clobetasol, halobetasol, betamethasone, hydrocortisone) can cause thinning and lightening of the skin if they are used for too long in the same area. Your physician has selected the right strength medicine for your problem and area affected on the body. Please use your medication only as directed by your physician to prevent side effects.   Recommend plastic glasses instead of rubber or metal frames. Consider patch testing in the future.   Pt to report condition on MyChart in 4 weeks. If not doing well will schedule follow up.  Return if symptoms worsen or  fail to improve.  Maylene Roes, CMA, am acting as scribe for Armida Sans, MD .  Documentation: I have reviewed the above documentation for accuracy and completeness, and I agree with the above.  Armida Sans, MD

## 2023-06-05 NOTE — Patient Instructions (Signed)

## 2023-06-16 ENCOUNTER — Encounter: Payer: Self-pay | Admitting: Dermatology
